# Patient Record
Sex: Female | Born: 1995 | Race: White | Hispanic: No | Marital: Married | Smoking: Current every day smoker
Health system: Southern US, Community
[De-identification: ages and names within clinical notes are randomized; demographics above are authoritative.]

---
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
## 2015-01-30 ENCOUNTER — Emergency Department (INDEPENDENT_AMBULATORY_CARE_PROVIDER_SITE_OTHER)
Admission: EM | Admit: 2015-01-30 | Discharge: 2015-01-30 | Disposition: A | Discharge status: Home / Self Care | Payer: Self-pay | Source: Home / Self Care | Attending: Family Medicine | Admitting: Family Medicine

## 2015-01-30 ENCOUNTER — Encounter (HOSPITAL_COMMUNITY): Payer: Self-pay | Admitting: Emergency Medicine

## 2015-01-30 DIAGNOSIS — S39012D Strain of muscle, fascia and tendon of lower back, subsequent encounter: Secondary | ICD-10-CM

## 2015-01-30 MED ORDER — BACLOFEN 10 MG PO TABS: 10.0000 mg | ORAL_TABLET | Freq: Three times a day (TID) | ORAL | Status: DC

## 2015-01-30 NOTE — Discharge Instructions (Signed)
Use Therma care heat patches and medicine as prescribed and see orthopedist if further problems.

## 2015-01-30 NOTE — ED Notes (Signed)
Right side/back pain.  Pain for a "few months" no known injury.  When pain is at its worst, it hurts at the shoulder blade to top of hip.  Pain changes in intensity/severity.  Reports pain never goes away.

## 2015-01-30 NOTE — ED Provider Notes (Signed)
CSN: [id number]     Arrival date & time 01/30/15  1305 History   First MD Initiated Contact with Patient 01/30/15 1411     Chief Complaint  Patient presents with  . Back Pain   (Consider location/radiation/quality/duration/timing/severity/associated sxs/prior Treatment) Patient is a 19 y.o. female presenting with back pain. The history is provided by the patient.  Back Pain Location:  Lumbar spine Quality:  Stabbing Radiates to:  Does not radiate Pain severity:  Moderate Onset quality:  Gradual Duration:  3 weeks Progression:  Worsening Chronicity:  Chronic (seen 09/2013 for same , given flexeril for 1 month,no resolution, now sx recurrent) Context: lifting heavy objects   Context: not occupational injury, not recent illness and not recent injury   Ineffective treatments:  Muscle relaxants Associated symptoms: no abdominal pain, no abdominal swelling, no bladder incontinence, no bowel incontinence, no fever, no leg pain, no numbness and no pelvic pain   Risk factors: not obese     History reviewed. No pertinent past medical history. History reviewed. No pertinent past surgical history. No family history on file. Social History  Substance Use Topics  . Smoking status: Current Every Day Smoker -- 3 years    Types: Cigarettes  . Smokeless tobacco: None  . Alcohol Use: No   OB History    No data available     Review of Systems  Constitutional: Negative.  Negative for fever.  Cardiovascular: Negative.   Gastrointestinal: Negative.  Negative for nausea, vomiting, abdominal pain, diarrhea and bowel incontinence.  Genitourinary: Negative.  Negative for bladder incontinence and pelvic pain.  Musculoskeletal: Positive for back pain. Negative for joint swelling and gait problem.  Skin: Negative.   Neurological: Negative for numbness.  All other systems reviewed and are negative.   Allergies  Vicodin  Home Medications   Prior to Admission medications   Medication Sig Start  Date End Date Taking? Authorizing Provider  Magnesium Salicylate Tetrahyd (DOANS EXTRA STRENGTH PO) Take by mouth.   Yes Historical Provider, MD  baclofen (LIORESAL) 10 MG tablet Take 1 tablet (10 mg total) by mouth 3 (three) times daily. 01/30/15   Linna Hoff, MD  cyclobenzaprine (FLEXERIL) 10 MG tablet Take 1 tablet (10 mg total) by mouth 2 (two) times daily as needed for muscle spasms. 09/05/13   Jennifer Piepenbrink, PA-C  ibuprofen (ADVIL,MOTRIN) 200 MG tablet Take 400 mg by mouth every 6 (six) hours as needed for moderate pain.    Historical Provider, MD  ibuprofen (ADVIL,MOTRIN) 600 MG tablet Take 1 tablet (600 mg total) by mouth every 6 (six) hours as needed. 09/05/13   Francee Piccolo, PA-C   Meds Ordered and Administered this Visit  Medications - No data to display  BP 116/75 mmHg  Pulse 84  Temp(Src) 98.4 F (36.9 C) (Oral)  Resp 16  SpO2 98%  LMP 01/07/2015 No data found.   Physical Exam  Constitutional: She is oriented to person, place, and time. She appears well-developed and well-nourished.  Abdominal: Soft. Bowel sounds are normal.  Musculoskeletal: She exhibits tenderness.       Lumbar back: She exhibits tenderness and pain. She exhibits normal range of motion, no bony tenderness, no swelling, no spasm and normal pulse.  Neurological: She is alert and oriented to person, place, and time.  Skin: Skin is warm.  Nursing note and vitals reviewed.   ED Course  Procedures (including critical care time)  Labs Review Labs Reviewed - No data to display  Imaging Review No results  found.   Visual Acuity Review  Right Eye Distance:   Left Eye Distance:   Bilateral Distance:    Right Eye Near:   Left Eye Near:    Bilateral Near:         MDM   1. Low back strain, subsequent encounter        Linna Hoff, MD 01/30/15 1433

## 2015-07-08 ENCOUNTER — Emergency Department (HOSPITAL_COMMUNITY)
Admission: EM | Admit: 2015-07-08 | Discharge: 2015-07-08 | Disposition: A | Discharge status: Home / Self Care | Payer: Medicaid Other | Attending: Emergency Medicine | Admitting: Emergency Medicine

## 2015-07-08 ENCOUNTER — Encounter (HOSPITAL_COMMUNITY): Payer: Self-pay | Admitting: *Deleted

## 2015-07-08 DIAGNOSIS — T148XXA Other injury of unspecified body region, initial encounter: Secondary | ICD-10-CM

## 2015-07-08 DIAGNOSIS — F17210 Nicotine dependence, cigarettes, uncomplicated: Secondary | ICD-10-CM | POA: Insufficient documentation

## 2015-07-08 DIAGNOSIS — Y9389 Activity, other specified: Secondary | ICD-10-CM | POA: Insufficient documentation

## 2015-07-08 DIAGNOSIS — Z79899 Other long term (current) drug therapy: Secondary | ICD-10-CM | POA: Insufficient documentation

## 2015-07-08 DIAGNOSIS — S39012A Strain of muscle, fascia and tendon of lower back, initial encounter: Secondary | ICD-10-CM | POA: Insufficient documentation

## 2015-07-08 DIAGNOSIS — X58XXXA Exposure to other specified factors, initial encounter: Secondary | ICD-10-CM | POA: Insufficient documentation

## 2015-07-08 DIAGNOSIS — Y9289 Other specified places as the place of occurrence of the external cause: Secondary | ICD-10-CM | POA: Insufficient documentation

## 2015-07-08 DIAGNOSIS — Y998 Other external cause status: Secondary | ICD-10-CM | POA: Insufficient documentation

## 2015-07-08 MED ORDER — IBUPROFEN 600 MG PO TABS: 600.0000 mg | ORAL_TABLET | Freq: Four times a day (QID) | ORAL | Status: DC | PRN

## 2015-07-08 MED ORDER — KETOROLAC TROMETHAMINE 60 MG/2ML IM SOLN
30.0000 mg | Freq: Once | INTRAMUSCULAR | Status: AC
  Administered 2015-07-08: 30 mg via INTRAMUSCULAR
  Filled 2015-07-08: qty 2

## 2015-07-08 MED ORDER — DICLOFENAC SODIUM 1 % TD GEL: 2.0000 g | Freq: Four times a day (QID) | TRANSDERMAL | Status: DC | PRN

## 2015-07-08 NOTE — Discharge Instructions (Signed)
Back Pain:  Your back pain should be treated with medicines such as ibuprofen or aleve and this back pain should get better over the next 2 weeks.  However if you develop severe or worsening pain, low back pain with fever, numbness, weakness or inability to walk or urinate, you should return to the ER immediately.  Please follow up with your doctor this week for a recheck if still having symptoms.  Low back pain is discomfort in the lower back that may be due to injuries to muscles and ligaments around the spine. Occasionally, it may be caused by a a problem to a part of the spine called a disc. The pain may last several days or a week;  However, most patients get completely well in 4 weeks.  COLD THERAPY DIRECTIONS:  Ice or gel packs can be used to reduce both pain and swelling. Ice is the most helpful within the first 24 to 48 hours after an injury or flareup from overusing a muscle or joint.  Ice is effective, has very few side effects, and is safe for most people to use.   If you expose your skin to cold temperatures for too long or without the proper protection, you can damage your skin or nerves. Watch for signs of skin damage due to cold.   HOME CARE INSTRUCTIONS  Follow these tips to use ice and cold packs safely.  Place a dry or damp towel between the ice and skin. A damp towel will cool the skin more quickly, so you may need to shorten the time that the ice is used.  For a more rapid response, add gentle compression to the ice.  Ice for no more than 10 to 20 minutes at a time. The bonier the area you are icing, the less time it will take to get the benefits of ice.  Check your skin after 5 minutes to make sure there are no signs of a poor response to cold or skin damage.  Rest 20 minutes or more in between uses.  Once your skin is numb, you can end your treatment. You can test numbness by very lightly touching your skin. The touch should be so light that you do not see the skin dimple  from the pressure of your fingertip. When using ice, most people will feel these normal sensations in this order: cold, burning, aching, and numbness.   Non steroidal anti inflammatory medications including Ibuprofen and naproxen;  These medications help both pain and swelling and are very useful in treating back pain.  They should be taken with food, as they can cause stomach upset, and more seriously, stomach bleeding.    You will need to follow up with  Your primary healthcare provider in 1-2 weeks for reassessment.  Be aware that if you develop new symptoms, such as a fever, leg weakness, difficulty with or loss of control of your urine or bowels, abdominal pain, or more severe pain, you will need to seek medical attention and  / or return to the Emergency department.

## 2015-07-08 NOTE — ED Provider Notes (Signed)
CSN: [id number]     Arrival date & time 07/08/15  1706 History  By signing my name below, I, Doreatha Martin, attest that this documentation has been prepared under the direction and in the presence of  [facility], PA-C. Electronically Signed: Doreatha Martin, ED Scribe. 07/08/2015. 5:23 PM.    Chief Complaint  Patient presents with  . Back Pain   The history is provided by the patient. No language interpreter was used.   HPI Comments: Gwendolyn Castillo is a 20 y.o. female otherwise healthy who presents to the Emergency Department complaining of moderate 8.5/10 right lower back pain with radiation to the right hip onset yesterday. Pt states associated intermittent paresthesia to the right hip, right knee. Pt denies recent trauma, injury, heavy lifting, falls, twisting, bending, increased activity. Pt states frequent h/o similar symptoms, being told that it is a pulled muscle. She states she has taken Baclofen and Flexeril in the past with little relief of symptoms. Pt also notes some mild relief with heat. Pt denies taking OTC medications at home to improve symptoms. She denies fever, abdominal pain, dysuria, frequency, hematuria, recent illness, bowel or bladder incontinence, saddle anesthesia, numbness to the bilateral lower extremities.    History reviewed. No pertinent past medical history. History reviewed. No pertinent past surgical history. History reviewed. No pertinent family history. Social History  Substance Use Topics  . Smoking status: Current Every Day Smoker -- 3 years    Types: Cigarettes  . Smokeless tobacco: None  . Alcohol Use: No   OB History    No data available     Review of Systems  Constitutional: Negative for fever.  HENT: Negative for congestion.   Respiratory: Negative for cough.   Cardiovascular: Negative for chest pain.  Gastrointestinal: Negative for abdominal pain.  Genitourinary: Negative for dysuria, frequency and hematuria.  Musculoskeletal: Positive  for myalgias and back pain.  Neurological: Negative for numbness.   Allergies  Vicodin  Home Medications   Prior to Admission medications   Medication Sig Start Date End Date Taking? Authorizing Provider  baclofen (LIORESAL) 10 MG tablet Take 1 tablet (10 mg total) by mouth 3 (three) times daily. 01/30/15   Linna Hoff, MD  cyclobenzaprine (FLEXERIL) 10 MG tablet Take 1 tablet (10 mg total) by mouth 2 (two) times daily as needed for muscle spasms. 09/05/13   Jennifer Piepenbrink, PA-C  diclofenac sodium (VOLTAREN) 1 % GEL Apply 2 g topically 4 (four) times daily as needed (pain). 07/08/15   Chase Picket Ward, PA-C  ibuprofen (ADVIL,MOTRIN) 600 MG tablet Take 1 tablet (600 mg total) by mouth every 6 (six) hours as needed. 07/08/15   Chase Picket Ward, PA-C  Magnesium Salicylate Tetrahyd (DOANS EXTRA STRENGTH PO) Take by mouth.    Historical Provider, MD   BP 115/81 mmHg  Pulse 115  Temp(Src) 97.9 F (36.6 C) (Oral)  Resp 16  Ht  (1.6 m)  Wt 47.628 kg  BMI 18.60 kg/m2  SpO2 100%  LMP 07/08/2015 Physical Exam  Constitutional: She is oriented to person, place, and time. She appears well-developed and well-nourished.  NAD  HENT:  Head: Normocephalic and atraumatic.  Neck: Normal range of motion. Neck supple.   Full ROM without pain  No midline tenderness  No tenderness of paraspinal musculature  Cardiovascular: Normal rate, regular rhythm and normal heart sounds.  Exam reveals no gallop and no friction rub.   No murmur heard. Pulmonary/Chest: Effort normal and breath sounds normal. No respiratory distress.  She has no wheezes. She has no rales.  Abdominal: Soft. Bowel sounds are normal. She exhibits no distension. There is no tenderness.  Musculoskeletal: Normal range of motion.       Arms: No C, T or L spine tenderness. TTP as depicted in image.  Full ROM. Able to ambulate without difficulty including walking on toes and heels.   Straight leg raises negative bilaterally for  radicular symptoms.  Neurological: She is alert and oriented to person, place, and time. She has normal reflexes.  Strength and sensation equal and intact bilaterally throughout the lower extremities.   Skin: Skin is warm and dry. No rash noted. No erythema.  Nursing note and vitals reviewed.   ED Course  Procedures (including critical care time) DIAGNOSTIC STUDIES: Oxygen Saturation is 100% on RA, normal by my interpretation.    COORDINATION OF CARE: 5:21 PM Discussed treatment plan with pt at bedside which includes IM toradol, topical voltarin gel and pt agreed to plan.    MDM   Final diagnoses:  Muscle strain    Patient with back pain.  No neurological deficits and normal neuro exam.  Patient is ambulatory. No loss of bowel or bladder control.  No concern for cauda equina. No red flags. No fever, night sweats, weight loss, h/o cancer, IVDA, no recent procedure to back. No urinary symptoms suggestive of UTI.  Toradol IM given in ED. Supportive care and return precaution discussed. Appears safe for discharge at this time. Follow up instructions given.   I personally performed the services described in this documentation, which was scribed in my presence. The recorded information has been reviewed and is accurate.   [facility], PA-C 07/08/15 1732  Pricilla Loveless, MD 07/09/15 2330

## 2015-07-08 NOTE — ED Notes (Signed)
Declined W/C at D/C and was escorted to lobby by RN. 

## 2015-07-08 NOTE — ED Notes (Signed)
Pt reports ongoing back pain with tingle down the RT leg.

## 2015-07-29 ENCOUNTER — Encounter (HOSPITAL_COMMUNITY): Payer: Self-pay

## 2015-07-29 ENCOUNTER — Emergency Department (HOSPITAL_COMMUNITY)
Admission: EM | Admit: 2015-07-29 | Discharge: 2015-07-29 | Disposition: A | Discharge status: Home / Self Care | Payer: Medicaid Other | Attending: Emergency Medicine | Admitting: Emergency Medicine

## 2015-07-29 ENCOUNTER — Emergency Department (HOSPITAL_COMMUNITY): Payer: Medicaid Other

## 2015-07-29 DIAGNOSIS — B349 Viral infection, unspecified: Secondary | ICD-10-CM

## 2015-07-29 DIAGNOSIS — J069 Acute upper respiratory infection, unspecified: Secondary | ICD-10-CM | POA: Insufficient documentation

## 2015-07-29 DIAGNOSIS — F17210 Nicotine dependence, cigarettes, uncomplicated: Secondary | ICD-10-CM | POA: Insufficient documentation

## 2015-07-29 DIAGNOSIS — Z79899 Other long term (current) drug therapy: Secondary | ICD-10-CM | POA: Insufficient documentation

## 2015-07-29 LAB — COMPREHENSIVE METABOLIC PANEL
ALT: 37 U/L (ref 14–54)
AST: 38 U/L (ref 15–41)
Albumin: 3.7 g/dL (ref 3.5–5.0)
Alkaline Phosphatase: 59 U/L (ref 38–126)
Anion gap: 9 (ref 5–15)
BUN: 5 mg/dL — AB (ref 6–20)
CHLORIDE: 108 mmol/L (ref 101–111)
CO2: 23 mmol/L (ref 22–32)
CREATININE: 0.77 mg/dL (ref 0.44–1.00)
Calcium: 9 mg/dL (ref 8.9–10.3)
GFR calc Af Amer: >60 mL/min (ref >60)
Glucose, Bld: 92 mg/dL (ref 65–99)
POTASSIUM: 3.8 mmol/L (ref 3.5–5.1)
SODIUM: 140 mmol/L (ref 135–145)
Total Bilirubin: 0.3 mg/dL (ref 0.3–1.2)
Total Protein: 7 g/dL (ref 6.5–8.1)

## 2015-07-29 LAB — CBC
HEMATOCRIT: 40.8 % (ref 36.0–46.0)
Hemoglobin: 14.1 g/dL (ref 12.0–15.0)
MCH: 30.9 pg (ref 26.0–34.0)
MCHC: 34.6 g/dL (ref 30.0–36.0)
MCV: 89.3 fL (ref 78.0–100.0)
Platelets: 252 10*3/uL (ref 150–400)
RBC: 4.57 MIL/uL (ref 3.87–5.11)
RDW: 12.7 % (ref 11.5–15.5)
WBC: 9.2 10*3/uL (ref 4.0–10.5)

## 2015-07-29 LAB — URINALYSIS, ROUTINE W REFLEX MICROSCOPIC
Bilirubin Urine: NEGATIVE
GLUCOSE, UA: NEGATIVE mg/dL
HGB URINE DIPSTICK: NEGATIVE
Ketones, ur: NEGATIVE mg/dL
LEUKOCYTES UA: NEGATIVE
Nitrite: NEGATIVE
PH: 6.5 (ref 5.0–8.0)
Protein, ur: NEGATIVE mg/dL
Specific Gravity, Urine: 1.012 (ref 1.005–1.030)

## 2015-07-29 LAB — LIPASE, BLOOD: LIPASE: 18 U/L (ref 11–51)

## 2015-07-29 LAB — I-STAT BETA HCG BLOOD, ED (MC, WL, AP ONLY): I-stat hCG, quantitative: <5 m[IU]/mL (ref <5)

## 2015-07-29 LAB — RAPID STREP SCREEN (MED CTR MEBANE ONLY): STREPTOCOCCUS, GROUP A SCREEN (DIRECT): NEGATIVE

## 2015-07-29 MED ORDER — BENZONATATE 100 MG PO CAPS: 100.0000 mg | ORAL_CAPSULE | Freq: Three times a day (TID) | ORAL | Status: DC

## 2015-07-29 MED ORDER — KETOROLAC TROMETHAMINE 30 MG/ML IJ SOLN
30.0000 mg | Freq: Once | INTRAMUSCULAR | Status: AC
  Administered 2015-07-29: 30 mg via INTRAMUSCULAR
  Filled 2015-07-29: qty 1

## 2015-07-29 MED ORDER — ONDANSETRON 4 MG PO TBDP
4.0000 mg | ORAL_TABLET | Freq: Once | ORAL | Status: AC
  Administered 2015-07-29: 4 mg via ORAL
  Filled 2015-07-29: qty 1

## 2015-07-29 MED ORDER — GUAIFENESIN ER 600 MG PO TB12: 600.0000 mg | ORAL_TABLET | Freq: Two times a day (BID) | ORAL | Status: DC | PRN

## 2015-07-29 MED ORDER — IBUPROFEN 800 MG PO TABS: 800.0000 mg | ORAL_TABLET | Freq: Three times a day (TID) | ORAL | Status: DC

## 2015-07-29 MED ORDER — ONDANSETRON 4 MG PO TBDP: 4.0000 mg | ORAL_TABLET | Freq: Three times a day (TID) | ORAL | Status: DC | PRN

## 2015-07-29 NOTE — ED Notes (Signed)
Pt here with c/o generalized body weakness, productive cough of thick yellow sputum, emesis "since about 7 this morning," and not able to tolerate PO. Pt also endorses HA.

## 2015-07-29 NOTE — Discharge Instructions (Signed)
Your chest x-ray and strep test were negative. You likely have a virus. I will give you several prescriptions to help with your symptoms. Return to the ER for new or worsening symptoms.

## 2015-07-29 NOTE — ED Provider Notes (Signed)
CSN: [id number]     Arrival date & time 07/29/15  1919 History  By signing my name below, I, Gwendolyn Castillo, attest that this documentation has been prepared under the direction and in the presence of Gwendolyn Castillo, [place]. Electronically Signed: Doreatha MartinEva Castillo, ED Scribe. 07/29/2015. 9:52 PM.    Chief Complaint  Patient presents with  . Cough  . Emesis   The history is provided by the patient. No language interpreter was used.   HPI Comments: Gwendolyn Castillo is a 20 y.o. female who presents to the Emergency Department complaining of moderate, worsening non-productive cough for a week with associated HA, nausea, emesis, chest congestion. Pt states sick contact with multiple family members who have strep and flu symptoms. She notes that her emesis is occasionally post-tussive but is mostly caused by nausea. Per pt, she has been unable to cough up mucous to relieve her chest congestion. She states that her emesis occasionally has mucous content. Pt states she has tried CIG[employer]lka seltzer, Nyquil, Dayquil, Theraflu with no relief of symptoms. She denies sore throat, fever, chills.   History reviewed. No pertinent past medical history. History reviewed. No pertinent past surgical history. No family history on file. Social History  Substance Use Topics  . Smoking status: Current Every Day Smoker -- 3 years    Types: Cigarettes  . Smokeless tobacco: None  . Alcohol Use: No   OB History    No data available     Review of Systems  Constitutional: Negative for fever and chills.  HENT: Positive for congestion. Negative for sore throat.   Respiratory: Positive for cough.   Gastrointestinal: Positive for nausea and vomiting.  Neurological: Positive for headaches.  All other systems reviewed and are negative.  Allergies  Vicodin  Home Medications   Prior to Admission medications   Medication Sig Start Date End Date Taking? Authorizing Provider  baclofen (LIORESAL) 10 MG tablet Take 1 tablet (10 mg  total) by mouth 3 (three) times daily. 01/30/15   Linna HoffJames D Kindl, MD  cyclobenzaprine (FLEXERIL) 10 MG tablet Take 1 tablet (10 mg total) by mouth 2 (two) times daily as needed for muscle spasms. 09/05/13   Jennifer Piepenbrink, PA-C  diclofenac sodium (VOLTAREN) 1 % GEL Apply 2 g topically 4 (four) times daily as needed (pain). 07/08/15   Chase PicketJaime Pilcher Ward, PA-C  ibuprofen (ADVIL,MOTRIN) 600 MG tablet Take 1 tablet (600 mg total) by mouth every 6 (six) hours as needed. 07/08/15   Chase PicketJaime Pilcher Ward, PA-C  Magnesium Salicylate Tetrahyd (DOANS EXTRA STRENGTH PO) Take by mouth.    Historical Provider, MD   BP 125/89 mmHg  Pulse 52  Temp(Src) 97.8 F (36.6 C) (Oral)  Resp 16  SpO2 97%  LMP 07/28/2015 Physical Exam  Constitutional: She is oriented to person, place, and time. She appears well-developed and well-nourished.  HENT:  Head: Normocephalic and atraumatic.  Right Ear: External ear normal.  Left Ear: External ear normal.  Mouth/Throat: Oropharynx is clear and moist. No oropharyngeal exudate.  Eyes: Conjunctivae and EOM are normal. Pupils are equal, round, and reactive to light.  Neck: Normal range of motion. Neck supple.  Cardiovascular: Normal rate, regular rhythm and normal heart sounds.   Pulmonary/Chest: Effort normal and breath sounds normal. No respiratory distress. She has no wheezes. She has no rales.  Abdominal: Soft. Bowel sounds are normal. She exhibits no distension. There is no tenderness.  Musculoskeletal: Normal range of motion.  Neurological: She is alert and oriented to person,  place, and time.  Skin: Skin is warm and dry.  Psychiatric: She has a normal mood and affect. Her behavior is normal.  Nursing note and vitals reviewed.   ED Course  Procedures (including critical care time) DIAGNOSTIC STUDIES: Oxygen Saturation is 97% on RA, normal by my interpretation.    COORDINATION OF CARE: 9:50 PM Discussed treatment plan with pt at bedside which includes lab work, CXR  and pt agreed to plan.   Labs Review Labs Reviewed  COMPREHENSIVE METABOLIC PANEL - Abnormal; Notable for the following:    BUN 5 (*)    All other components within normal limits  RAPID STREP SCREEN (NOT AT [facility])  CULTURE, GROUP A STREP ([facility])  LIPASE, BLOOD  CBC  URINALYSIS, ROUTINE W REFLEX MICROSCOPIC (NOT AT [facility])  I-STAT BETA HCG BLOOD, ED ([facility], [facility], [facility] ONLY)    Imaging Review Dg Chest 2 View  07/29/2015  CLINICAL DATA:  Moderate, worsening nonproductive cough for a week. Headaches, nausea, vomiting, chest congestion. Smoker. EXAM: CHEST  2 VIEW COMPARISON:  None. FINDINGS: Mild hyperinflation. This could be due to asthma or emphysema. The heart size and mediastinal contours are within normal limits. Both lungs are clear. The visualized skeletal structures are unremarkable. IMPRESSION: No active cardiopulmonary disease. Electronically Signed   By: Burman Nieves M.D.   On: 07/29/2015 22:13   I have personally reviewed and evaluated these images and lab results as part of my medical decision-making.   MDM   Final diagnoses:  Viral syndrome  URI (upper respiratory infection)    Labs obtained in triage unremarkable. CXR negative. Rapid strep negative. Suspect viral syndrome. Pt without nausea at this time. Tolerating PO. Feels improved with toradol. Rx given for supportive meds. Instructed to f/u with PCP within one week. ER return precautions given.  I personally performed the services described in this documentation, which was scribed in my presence. The recorded information has been reviewed and is accurate.   Carlene Coria, PA-C 07/29/15 2253  Loren Racer, MD 07/30/15 Rich Fuchs

## 2015-08-01 LAB — CULTURE, GROUP A STREP (THRC)

## 2018-01-09 ENCOUNTER — Ambulatory Visit (HOSPITAL_COMMUNITY)
Admission: EM | Admit: 2018-01-09 | Discharge: 2018-01-09 | Disposition: A | Discharge status: Home / Self Care | Payer: Self-pay | Attending: Family Medicine | Admitting: Family Medicine

## 2018-01-09 ENCOUNTER — Encounter (HOSPITAL_COMMUNITY): Payer: Self-pay

## 2018-01-09 DIAGNOSIS — M546 Pain in thoracic spine: Secondary | ICD-10-CM

## 2018-01-09 DIAGNOSIS — R109 Unspecified abdominal pain: Secondary | ICD-10-CM

## 2018-01-09 DIAGNOSIS — R110 Nausea: Secondary | ICD-10-CM

## 2018-01-09 MED ORDER — MELOXICAM 7.5 MG PO TABS: 7.5000 mg | ORAL_TABLET | Freq: Every day | ORAL | 0 refills | Status: AC

## 2018-01-09 MED ORDER — KETOROLAC TROMETHAMINE 30 MG/ML IJ SOLN
INTRAMUSCULAR | Status: AC
  Filled 2018-01-09: qty 1

## 2018-01-09 MED ORDER — CYCLOBENZAPRINE HCL 5 MG PO TABS: 5.0000 mg | ORAL_TABLET | Freq: Every evening | ORAL | 0 refills | Status: AC | PRN

## 2018-01-09 MED ORDER — KETOROLAC TROMETHAMINE 30 MG/ML IJ SOLN
30.0000 mg | Freq: Once | INTRAMUSCULAR | Status: AC
  Administered 2018-01-09: 30 mg via INTRAMUSCULAR

## 2018-01-09 MED ORDER — ONDANSETRON 4 MG PO TBDP: 4.0000 mg | ORAL_TABLET | Freq: Three times a day (TID) | ORAL | 0 refills | Status: AC | PRN

## 2018-01-09 NOTE — ED Provider Notes (Signed)
[facility]    CSN: [id number] Arrival date & time: 01/09/18  1607     History   Chief Complaint Chief Complaint  Patient presents with  . Flank Pain in Back Area    HPI Gwendolyn Castillo is a 22 y.o. female.   22 year old female comes in for 7-day history of right flank pain.  States started while she was at work, no obvious injury/trauma.  Pain was intermittent and mild when it first started, worse with movement.  Frequency has gradually increased with worsening intensity as well.  Denies any urinary symptoms such as frequency, dysuria, hematuria.  Denies fever, chills, night sweats.  States woke up this morning with significantly more pain, with nausea.  Denies vomiting.  Took Tylenol without relief.  Denies history of kidney stones.  Denies abdominal pain.     History reviewed. No pertinent past medical history.  Patient Active Problem List   Diagnosis Date Noted  . CHICKENPOX, HX OF 09/08/2009  . MENORRHAGIA 07/14/2009    History reviewed. No pertinent surgical history.  OB History   None      Home Medications    Prior to Admission medications   Medication Sig Start Date End Date Taking? Authorizing Provider  cyclobenzaprine (FLEXERIL) 5 MG tablet Take 1 tablet (5 mg total) by mouth at bedtime as needed for muscle spasms. 01/09/18   Cathie Hoops, Amy V, PA-C  Magnesium Salicylate Tetrahyd (DOANS EXTRA STRENGTH PO) Take by mouth.    [provider]  meloxicam (MOBIC) 7.5 MG tablet Take 1 tablet (7.5 mg total) by mouth daily. 01/09/18   Cathie Hoops, Amy V, PA-C  ondansetron (ZOFRAN ODT) 4 MG disintegrating tablet Take 1 tablet (4 mg total) by mouth every 8 (eight) hours as needed for nausea or vomiting. 01/09/18   Belinda Fisher, PA-C    Family History History reviewed. No pertinent family history.  Social History Social History   Tobacco Use  . Smoking status: Current Every Day Smoker    Years: 3.00    Types: Cigarettes  Substance Use Topics  . Alcohol use: No  .  Drug use: Not on file     Allergies   Vicodin [hydrocodone-acetaminophen]   Review of Systems Review of Systems  Reason unable to perform ROS: See HPI as above.     Physical Exam Triage Vital Signs ED Triage Vitals  Enc Vitals Group     BP 01/09/18 1639 121/75     Pulse Rate 01/09/18 1639 80     Resp 01/09/18 1639 20     Temp 01/09/18 1639 98.8 F (37.1 C)     Temp Source 01/09/18 1639 Temporal     SpO2 01/09/18 1639 100 %     Weight --      Height --      Head Circumference --      Peak Flow --      Pain Score 01/09/18 1638 9     Pain Loc --      Pain Edu? --      Excl. in GC? --    No data found.  Updated Vital Signs BP 121/75 (BP Location: Left Arm)   Pulse 80   Temp 98.8 F (37.1 C) (Temporal)   Resp 20   LMP 01/01/2018   SpO2 100%   Physical Exam  Constitutional: She is oriented to person, place, and time. She appears well-developed and well-nourished. No distress.  HENT:  Head: Normocephalic and atraumatic.  Eyes: Pupils are equal,  round, and reactive to light. Conjunctivae are normal.  Cardiovascular: Normal rate, regular rhythm and normal heart sounds. Exam reveals no gallop and no friction rub.  No murmur heard. Pulmonary/Chest: Effort normal and breath sounds normal. She has no wheezes. She has no rales.  Abdominal: Soft. Bowel sounds are normal. She exhibits no mass. There is no tenderness. There is no rebound, no guarding and no CVA tenderness.  Musculoskeletal:  No tenderness to palpation of spinous processes. Tenderness to palpation along right paraspinal muscles of the thoracic region. Full ROM of back, hips. Strength normal, sensation intact. Negative straight leg raise.   Neurological: She is alert and oriented to person, place, and time.  Skin: Skin is warm and dry.  Psychiatric: She has a normal mood and affect. Her behavior is normal. Judgment normal.    UC Treatments / Results  Labs (all labs ordered are listed, but only abnormal  results are displayed) Labs Reviewed - No data to display  EKG None  Radiology No results found.  Procedures Procedures (including critical care time)  Medications Ordered in UC Medications  ketorolac (TORADOL) 30 MG/ML injection 30 mg (has no administration in time range)    Initial Impression / Assessment and Plan / UC Course  I have reviewed the triage vital signs and the nursing notes.  Pertinent labs & imaging results that were available during my care of the patient were reviewed by me and considered in my medical decision making (see chart for details).    Pain reproducible by palpation, less concerning for kidney stone, UTI.  Discussed able to obtain urine sample, patient would like to defer for now.  Toradol injection in office today. Start NSAID as directed for pain and inflammation. Muscle relaxant as needed. Ice/heat compresses. Discussed with patient strain can take up to 3-4 weeks to resolve, but should be getting better each week. Return precautions given.   Final Clinical Impressions(s) / UC Diagnoses   Final diagnoses:  Acute right-sided thoracic back pain    ED Prescriptions    Medication Sig Dispense Auth. Provider   meloxicam (MOBIC) 7.5 MG tablet Take 1 tablet (7.5 mg total) by mouth daily. 15 tablet Yu, Amy V, PA-C   cyclobenzaprine (FLEXERIL) 5 MG tablet Take 1 tablet (5 mg total) by mouth at bedtime as needed for muscle spasms. 10 tablet Yu, Amy V, PA-C   ondansetron (ZOFRAN ODT) 4 MG disintegrating tablet Take 1 tablet (4 mg total) by mouth every 8 (eight) hours as needed for nausea or vomiting. 10 tablet Threasa Alpha, PA-C 01/09/18 1719

## 2018-01-09 NOTE — ED Triage Notes (Signed)
Pt presents with Flank Pain in Back Area on right side.

## 2018-01-09 NOTE — Discharge Instructions (Addendum)
Toradol in office today. Start Mobic. Do not take ibuprofen (motrin/advil)/ naproxen (aleve) while on mobic. Flexeril as needed at night. Flexeril can make you drowsy, so do not take if you are going to drive, operate heavy machinery, or make important decisions. Ice/heat compresses as needed. This can take up to 3-4 weeks to completely resolve, but you should be feeling better each week. Follow up here or with PCP if symptoms worsen, changes for reevaluation. If experience numbness/tingling of the inner thighs, loss of bladder or bowel control, go to the emergency department for evaluation.   As discussed, cannot rule out kidney stone, though less likely based off of your symptoms. Keep hydrated, your urine should be clear to pale yellow in color. You can take zofran as needed for nausea/vomiting. If experiencing worsening symptoms, abdominal pain, nausea/vomiting not controlled by medicine, fever, blood in urine, follow up for reevaluation needed.

## 2018-08-13 ENCOUNTER — Encounter (HOSPITAL_COMMUNITY): Payer: Self-pay

## 2018-08-13 ENCOUNTER — Emergency Department (HOSPITAL_COMMUNITY)
Admission: EM | Admit: 2018-08-13 | Discharge: 2018-08-13 | Disposition: A | Discharge status: Home / Self Care | Payer: Self-pay | Attending: Emergency Medicine | Admitting: Emergency Medicine

## 2018-08-13 ENCOUNTER — Emergency Department (HOSPITAL_COMMUNITY): Payer: Self-pay

## 2018-08-13 ENCOUNTER — Other Ambulatory Visit: Payer: Self-pay

## 2018-08-13 DIAGNOSIS — F17210 Nicotine dependence, cigarettes, uncomplicated: Secondary | ICD-10-CM | POA: Insufficient documentation

## 2018-08-13 DIAGNOSIS — Z79899 Other long term (current) drug therapy: Secondary | ICD-10-CM | POA: Insufficient documentation

## 2018-08-13 DIAGNOSIS — J069 Acute upper respiratory infection, unspecified: Secondary | ICD-10-CM | POA: Insufficient documentation

## 2018-08-13 LAB — I-STAT BETA HCG BLOOD, ED (MC, WL, AP ONLY): I-stat hCG, quantitative: <5 m[IU]/mL (ref <5)

## 2018-08-13 MED ORDER — AEROCHAMBER PLUS FLO-VU MEDIUM MISC
1.0000 | Freq: Once | Status: DC
  Filled 2018-08-13: qty 1

## 2018-08-13 MED ORDER — PROMETHAZINE-DM 6.25-15 MG/5ML PO SYRP: 5.0000 mL | ORAL_SOLUTION | Freq: Three times a day (TID) | ORAL | 0 refills | Status: AC | PRN

## 2018-08-13 MED ORDER — AEROCHAMBER PLUS FLO-VU LARGE MISC
1.0000 | Freq: Once | Status: AC
  Administered 2018-08-13: 15:00:00 1

## 2018-08-13 MED ORDER — AEROCHAMBER PLUS FLO-VU LARGE MISC
Status: AC
  Administered 2018-08-13: 1
  Filled 2018-08-13: qty 1

## 2018-08-13 MED ORDER — ALBUTEROL SULFATE HFA 108 (90 BASE) MCG/ACT IN AERS
2.0000 | INHALATION_SPRAY | Freq: Once | RESPIRATORY_TRACT | Status: AC
  Administered 2018-08-13: 2 via RESPIRATORY_TRACT
  Filled 2018-08-13: qty 6.7

## 2018-08-13 MED ORDER — BENZONATATE 100 MG PO CAPS: 100.0000 mg | ORAL_CAPSULE | Freq: Three times a day (TID) | ORAL | 0 refills | Status: AC | PRN

## 2018-08-13 NOTE — ED Triage Notes (Addendum)
Pt arrives POV, had headaches since sunday, non-productive coughs, woke up today with some shortness of breath and chest tightness. Pt reports chest pain is 5/10. Pt took 1000 mg of tylenol at home this morning and no current HA. Pt alert, oriented and ambulatory. Pt denies recent travel or contact with sick persons.

## 2018-08-13 NOTE — Discharge Instructions (Addendum)
Drink plenty of fluids.   Monitor your temperature.   You should self-quarantine and separate yourself from family. You can return to work in NO LESS than 7 days, and NO SOONER than 3 days after you break your fever (no fever with no tylenol or advil > 3 days).      Person Under Monitoring Name: Gwendolyn HamburgerGloria Whittenburg  Location: [address] [place] [id number]   Infection Prevention Recommendations for Individuals Confirmed to have, or Being Evaluated for, 2019 Novel Coronavirus (COVID-19) Infection Who Receive Care at Home  Individuals who are confirmed to have, or are being evaluated for, COVID-19 should follow the prevention steps below until a healthcare provider or local or state health department says they can return to normal activities.  Stay home except to get medical care You should restrict activities outside your home, except for getting medical care. Do not go to work, school, or public areas, and do not use public transportation or taxis.  Call ahead before visiting your doctor Before your medical appointment, call the healthcare provider and tell them that you have, or are being evaluated for, COVID-19 infection. This will help the healthcare providers office take steps to keep other people from getting infected. Ask your healthcare provider to call the local or state health department.  Monitor your symptoms Seek prompt medical attention if your illness is worsening (e.g., difficulty breathing). Before going to your medical appointment, call the healthcare provider and tell them that you have, or are being evaluated for, COVID-19 infection. Ask your healthcare provider to call the local or state health department.  Wear a facemask You should wear a facemask that covers your nose and mouth when you are in the same room with other people and when you visit a healthcare provider. People who live with or visit you should also wear a facemask while they are  in the same room with you.  Separate yourself from other people in your home As much as possible, you should stay in a different room from other people in your home. Also, you should use a separate bathroom, if available.  Avoid sharing household items You should not share dishes, drinking glasses, cups, eating utensils, towels, bedding, or other items with other people in your home. After using these items, you should wash them thoroughly with soap and water.  Cover your coughs and sneezes Cover your mouth and nose with a tissue when you cough or sneeze, or you can cough or sneeze into your sleeve. Throw used tissues in a lined trash can, and immediately wash your hands with soap and water for at least 20 seconds or use an alcohol-based hand rub.  Wash your [employer] Corporationhands Wash your hands often and thoroughly with soap and water for at least 20 seconds. You can use an alcohol-based hand sanitizer if soap and water are not available and if your hands are not visibly dirty. Avoid touching your eyes, nose, and mouth with unwashed hands.   Prevention Steps for Caregivers and Household Members of Individuals Confirmed to have, or Being Evaluated for, COVID-19 Infection Being Cared for in the Home  If you live with, or provide care at home for, a person confirmed to have, or being evaluated for, COVID-19 infection please follow these guidelines to prevent infection:  Follow healthcare providers instructions Make sure that you understand and can help the patient follow any healthcare provider instructions for all care.  Provide for the patients basic needs You should help the  patient with basic needs in the home and provide support for getting groceries, prescriptions, and other personal needs.  Monitor the patients symptoms If they are getting sicker, call his or her medical provider and tell them that the patient has, or is being evaluated for, COVID-19 infection. This will help the  healthcare providers office take steps to keep other people from getting infected. Ask the healthcare provider to call the local or state health department.  Limit the number of people who have contact with the patient If possible, have only one caregiver for the patient. Other household members should stay in another home or place of residence. If this is not possible, they should stay in another room, or be separated from the patient as much as possible. Use a separate bathroom, if available. Restrict visitors who do not have an essential need to be in the home.  Keep older adults, very young children, and other sick people away from the patient Keep older adults, very young children, and those who have compromised immune systems or chronic health conditions away from the patient. This includes people with chronic heart, lung, or kidney conditions, diabetes, and cancer.  Ensure good ventilation Make sure that shared spaces in the home have good air flow, such as from an air conditioner or an opened window, weather permitting.  Wash your hands often Wash your hands often and thoroughly with soap and water for at least 20 seconds. You can use an alcohol based hand sanitizer if soap and water are not available and if your hands are not visibly dirty. Avoid touching your eyes, nose, and mouth with unwashed hands. Use disposable paper towels to dry your hands. If not available, use dedicated cloth towels and replace them when they become wet.  Wear a facemask and gloves Wear a disposable facemask at all times in the room and gloves when you touch or have contact with the patients blood, body fluids, and/or secretions or excretions, such as sweat, saliva, sputum, nasal mucus, vomit, urine, or feces.  Ensure the mask fits over your nose and mouth tightly, and do not touch it during use. Throw out disposable facemasks and gloves after using them. Do not reuse. Wash your hands immediately after  removing your facemask and gloves. If your personal clothing becomes contaminated, carefully remove clothing and launder. Wash your hands after handling contaminated clothing. Place all used disposable facemasks, gloves, and other waste in a lined container before disposing them with other household waste. Remove gloves and wash your hands immediately after handling these items.  Do not share dishes, glasses, or other household items with the patient Avoid sharing household items. You should not share dishes, drinking glasses, cups, eating utensils, towels, bedding, or other items with a patient who is confirmed to have, or being evaluated for, COVID-19 infection. After the person uses these items, you should wash them thoroughly with soap and water.  Wash laundry thoroughly Immediately remove and wash clothes or bedding that have blood, body fluids, and/or secretions or excretions, such as sweat, saliva, sputum, nasal mucus, vomit, urine, or feces, on them. Wear gloves when handling laundry from the patient. Read and follow directions on labels of laundry or clothing items and detergent. In general, wash and dry with the warmest temperatures recommended on the label.  Clean all areas the individual has used often Clean all touchable surfaces, such as counters, tabletops, doorknobs, bathroom fixtures, toilets, phones, keyboards, tablets, and bedside tables, every day. Also, clean any surfaces that  may have blood, body fluids, and/or secretions or excretions on them. Wear gloves when cleaning surfaces the patient has come in contact with. Use a diluted bleach solution (e.g., dilute bleach with 1 part bleach and 10 parts water) or a household disinfectant with a label that says EPA-registered for coronaviruses. To make a bleach solution at home, add 1 tablespoon of bleach to 1 quart (4 cups) of water. For a larger supply, add  cup of bleach to 1 gallon (16 cups) of water. Read labels of cleaning  products and follow recommendations provided on product labels. Labels contain instructions for safe and effective use of the cleaning product including precautions you should take when applying the product, such as wearing gloves or eye protection and making sure you have good ventilation during use of the product. Remove gloves and wash hands immediately after cleaning.  Monitor yourself for signs and symptoms of illness Caregivers and household members are considered close contacts, should monitor their health, and will be asked to limit movement outside of the home to the extent possible. Follow the monitoring steps for close contacts listed on the symptom monitoring form.   ? If you have additional questions, contact your local health department or call the epidemiologist on call at [phone] (available 24/7). ? This guidance is subject to change. For the most up-to-date guidance from [facility], please refer to their website: YouBlogs.pl

## 2018-08-13 NOTE — ED Notes (Signed)
Patient verbalizes understanding of discharge instructions. Opportunity for questioning and answers were provided. Armband removed by staff, pt discharged from ED. Pt ambulatory to lobby.  

## 2018-08-13 NOTE — ED Provider Notes (Signed)
[facility] Provider Note   CSN: [id number] Arrival date & time: 08/13/18  1236    History   Chief Complaint Chief Complaint  Patient presents with  . Cough  . Shortness of Breath    HPI Gwendolyn HamburgerGloria Schildt is a 23 y.o. female.     HPI   23 yo F with PMhx as below here w/ cough, SOB. Pt states that over the past 24 hours, she's developed mild nasal congestion, dry cough, and occasional SOB. She has not had any fevers. She was outside all day yesterday but does not have a h/o allergies. She reports that over the past day, she's had worsening cough and mild SOB, though she was able to walk >0.5 miles today w/o significant dyspnea. No CP. No known sick contacts. No recent travel outside of the [place] She's had a mils sore throat that is scratchy, worse with swallowing. No alleviating factors. No other complaints.  History reviewed. No pertinent past medical history.  Patient Active Problem List   Diagnosis Date Noted  . CHICKENPOX, HX OF 09/08/2009  . MENORRHAGIA 07/14/2009    History reviewed. No pertinent surgical history.   OB History   No obstetric history on file.      Home Medications    Prior to Admission medications   Medication Sig Start Date End Date Taking? Authorizing Provider  benzonatate (TESSALON) 100 MG capsule Take 1 capsule (100 mg total) by mouth 3 (three) times daily as needed for up to 7 days for cough. 08/13/18 08/20/18  Shaune PollackIsaacs, Nimisha Rathel, MD  cyclobenzaprine (FLEXERIL) 5 MG tablet Take 1 tablet (5 mg total) by mouth at bedtime as needed for muscle spasms. 01/09/18   Cathie HoopsYu, Amy V, PA-C  Magnesium Salicylate Tetrahyd (DOANS EXTRA STRENGTH PO) Take by mouth.    [provider]  meloxicam (MOBIC) 7.5 MG tablet Take 1 tablet (7.5 mg total) by mouth daily. 01/09/18   Cathie HoopsYu, Amy V, PA-C  ondansetron (ZOFRAN ODT) 4 MG disintegrating tablet Take 1 tablet (4 mg total) by mouth every 8 (eight) hours as needed for nausea or vomiting.  01/09/18   Cathie HoopsYu, Amy V, PA-C  promethazine-dextromethorphan (PROMETHAZINE-DM) 6.25-15 MG/5ML syrup Take 5 mLs by mouth 3 (three) times daily as needed for cough. 08/13/18   Shaune PollackIsaacs, Iridessa Harrow, MD    Family History No family history on file.  Social History Social History   Tobacco Use  . Smoking status: Current Every Day Smoker    Years: 3.00    Types: Cigarettes  Substance Use Topics  . Alcohol use: No  . Drug use: Not on file     Allergies   Vicodin [hydrocodone-acetaminophen]   Review of Systems Review of Systems  Constitutional: Positive for fatigue. Negative for chills and fever.  HENT: Positive for congestion and sore throat. Negative for rhinorrhea.   Eyes: Negative for visual disturbance.  Respiratory: Negative for cough, shortness of breath and wheezing.   Cardiovascular: Negative for chest pain and leg swelling.  Gastrointestinal: Negative for abdominal pain, diarrhea, nausea and vomiting.  Genitourinary: Negative for dysuria and flank pain.  Musculoskeletal: Negative for neck pain and neck stiffness.  Skin: Negative for rash and wound.  Allergic/Immunologic: Negative for immunocompromised state.  Neurological: Negative for syncope, weakness and headaches.  All other systems reviewed and are negative.    Physical Exam Updated Vital Signs BP (!) 117/105 (BP Location: Right Arm)   Pulse 72   Temp 98.2 F (36.8 C) (Oral)   Resp  16   LMP 08/05/2018   SpO2 99%   Physical Exam Vitals signs and nursing note reviewed.  Constitutional:      General: She is not in acute distress.    Appearance: She is well-developed.  HENT:     Head: Normocephalic and atraumatic.     Comments: Mild nasal congestion and pharyngeal erythema. No tonsillar exudates. Eyes:     Conjunctiva/sclera: Conjunctivae normal.  Neck:     Musculoskeletal: Neck supple.  Cardiovascular:     Rate and Rhythm: Normal rate and regular rhythm.     Heart sounds: Normal heart sounds.  Pulmonary:      Effort: Pulmonary effort is normal. No respiratory distress.     Breath sounds: No wheezing.     Comments: Scant end-expiratory wheezes, normal WOB. Normal aeration, normal WOB. Abdominal:     General: There is no distension.  Skin:    General: Skin is warm.     Capillary Refill: Capillary refill takes less than 2 seconds.     Findings: No rash.  Neurological:     Mental Status: She is alert and oriented to person, place, and time.     Motor: No abnormal muscle tone.      ED Treatments / Results  Labs (all labs ordered are listed, but only abnormal results are displayed) Labs Reviewed  I-STAT BETA HCG BLOOD, ED ([facility], [facility], [facility] ONLY)    EKG None  Radiology Dg Chest Portable 1 View  Result Date: 08/13/2018 CLINICAL DATA:  Chest pain, shortness of breath EXAM: PORTABLE CHEST 1 VIEW COMPARISON:  07/29/2015 FINDINGS: Heart and mediastinal contours are within normal limits. No focal opacities or effusions. No acute bony abnormality. IMPRESSION: No active disease. Electronically Signed   By: Charlett Nose M.D.   On: 08/13/2018 13:53    Procedures Procedures (including critical care time)  Medications Ordered in ED Medications  albuterol (PROVENTIL HFA;VENTOLIN HFA) 108 (90 Base) MCG/ACT inhaler 2 puff (has no administration in time range)  AeroChamber Plus Flo-Vu Large MISC 1 each (has no administration in time range)  AeroChamber Plus Flo-Vu Large MISC (has no administration in time range)     Initial Impression / Assessment and Plan / ED Course  I have reviewed the triage vital signs and the nursing notes.  Pertinent labs & imaging results that were available during my care of the patient were reviewed by me and considered in my medical decision making (see chart for details).        23 yo F here with cough, SOB, general fatigue. Pt is afebrile and very well appearing and in NAD here. She is satting well on RA. Lungs CTAB and CXR is clear. HCG neg. No signs of acute bacterial  PNA. Low concern for COVID clinically but given high risk of transmission in community, will have pt quarantine and tx at home supportively. Pt does smoke, has mild wheeze so given albuterol neb. ABX not indicated clinically.  Patient evaluated, tested and sent home with instructions for home care and Quarantine. Instructed to seek further care if symptoms worsen.  Is set up with follow up for a virtual visit with PCP in next 24-48 hours.  Final Clinical Impressions(s) / ED Diagnoses   Final diagnoses:  Viral upper respiratory tract infection    ED Discharge Orders         Ordered    benzonatate (TESSALON) 100 MG capsule  3 times daily PRN     08/13/18 1404  promethazine-dextromethorphan (PROMETHAZINE-DM) 6.25-15 MG/5ML syrup  3 times daily PRN     08/13/18 1404           Shaune Pollack, MD 08/13/18 1437

## 2018-08-13 NOTE — ED Notes (Signed)
Radiology at bedside for CXR

## 2018-08-21 ENCOUNTER — Encounter: Payer: Self-pay | Admitting: Primary Care

## 2018-08-21 ENCOUNTER — Other Ambulatory Visit: Payer: Self-pay

## 2018-08-21 ENCOUNTER — Ambulatory Visit: Payer: Self-pay | Attending: Primary Care | Admitting: Primary Care

## 2018-08-21 DIAGNOSIS — J302 Other seasonal allergic rhinitis: Secondary | ICD-10-CM

## 2018-08-21 DIAGNOSIS — Z7689 Persons encountering health services in other specified circumstances: Secondary | ICD-10-CM

## 2018-08-21 DIAGNOSIS — R059 Cough, unspecified: Secondary | ICD-10-CM

## 2018-08-21 DIAGNOSIS — J988 Other specified respiratory disorders: Secondary | ICD-10-CM

## 2018-08-21 DIAGNOSIS — R05 Cough: Secondary | ICD-10-CM

## 2018-08-21 MED ORDER — BENZONATATE 100 MG PO CAPS: 100.0000 mg | ORAL_CAPSULE | Freq: Two times a day (BID) | ORAL | 0 refills | Status: AC | PRN

## 2018-08-21 MED ORDER — FEXOFENADINE HCL 60 MG PO TABS: 60.0000 mg | ORAL_TABLET | Freq: Two times a day (BID) | ORAL | Status: AC

## 2018-08-21 NOTE — Progress Notes (Signed)
Acute Office Visit  Subjective:    Patient ID: Gwendolyn Castillo, female    DOB: [birth year], 23 y.o.   MRN: [id number]  Chief Complaint  Patient presents with  . Hospitalization Follow-up    HPI Patient is seen today tele health due to COVID-19. She was seen in the ED for URI ,scant expiratory wheezes , shortness of breath and chest x-ray were clear no signs of acute bacteria PNA. She was recc to self quarantine and tx at home supportively. Patients still has a cough but job will not allow her to rtn without a doctors note. Ms. Gwendolyn Castillo may rtn to work after 14 days of self quarantine.  History reviewed. No pertinent past medical history.  History reviewed. No pertinent surgical history.  History reviewed. No pertinent family history.  Social History   Socioeconomic History  . Marital status: Married    Spouse name: Not on file  . Number of children: Not on file  . Years of education: Not on file  . Highest education level: Not on file  Occupational History  . Not on file  Social Needs  . Financial resource strain: Not on file  . Food insecurity:    Worry: Not on file    Inability: Not on file  . Transportation needs:    Medical: Not on file    Non-medical: Not on file  Tobacco Use  . Smoking status: Current Every Day Smoker    Years: 3.00    Types: Cigarettes  . Smokeless tobacco: Never Used  Substance and Sexual Activity  . Alcohol use: No  . Drug use: Not on file  . Sexual activity: Never  Lifestyle  . Physical activity:    Days per week: Not on file    Minutes per session: Not on file  . Stress: Not on file  Relationships  . Social connections:    Talks on phone: Not on file    Gets together: Not on file    Attends religious service: Not on file    Active member of club or organization: Not on file    Attends meetings of clubs or organizations: Not on file    Relationship status: Not on file  . Intimate partner violence:    Fear of current or ex  partner: Not on file    Emotionally abused: Not on file    Physically abused: Not on file    Forced sexual activity: Not on file  Other Topics Concern  . Not on file  Social History Narrative  . Not on file    Outpatient Medications Prior to Visit  Medication Sig Dispense Refill  . cyclobenzaprine (FLEXERIL) 5 MG tablet Take 1 tablet (5 mg total) by mouth at bedtime as needed for muscle spasms. 10 tablet 0  . Magnesium Salicylate Tetrahyd (DOANS EXTRA STRENGTH PO) Take by mouth.    . promethazine-dextromethorphan (PROMETHAZINE-DM) 6.25-15 MG/5ML syrup Take 5 mLs by mouth 3 (three) times daily as needed for cough. 118 mL 0  . meloxicam (MOBIC) 7.5 MG tablet Take 1 tablet (7.5 mg total) by mouth daily. (Patient not taking: Reported on 08/21/2018) 15 tablet 0  . ondansetron (ZOFRAN ODT) 4 MG disintegrating tablet Take 1 tablet (4 mg total) by mouth every 8 (eight) hours as needed for nausea or vomiting. (Patient not taking: Reported on 08/21/2018) 10 tablet 0   No facility-administered medications prior to visit.     Allergies  Allergen Reactions  . Vicodin [Hydrocodone-Acetaminophen] Itching  Review of Systems  Constitutional: Negative.   HENT: Positive for rhinorrhea.   Eyes: Negative.   Respiratory: Positive for cough.   Cardiovascular: Negative.   Gastrointestinal: Negative.   Genitourinary: Negative.   Skin: Negative.        Objective:    Physical Exam  LMP 08/05/2018  Wt Readings from Last 3 Encounters:  07/08/15 105 lb (47.6 kg) (8 %, Z= -1.40)*  09/05/13 104 lb 15 oz (47.6 kg) (12 %, Z= -1.19)*  08/03/12 105 lb (47.6 kg) (16 %, Z= -0.99)*   * Growth percentiles are based on CDC (Girls, 2-20 Years) data.    Health Maintenance Due  Topic Date Due  . HIV Screening  10/23/2010  . TETANUS/TDAP  10/23/2014  . PAP-Cervical Cytology Screening  10/22/2016  . PAP SMEAR-Modifier  10/22/2016    There are no preventive care reminders to display for this patient.    Lab Results  Component Value Date   TSH 1.146 07/14/2009   Lab Results  Component Value Date   WBC 9.2 07/29/2015   HGB 14.1 07/29/2015   HCT 40.8 07/29/2015   MCV 89.3 07/29/2015   PLT 252 07/29/2015   Lab Results  Component Value Date   NA 140 07/29/2015   K 3.8 07/29/2015   CO2 23 07/29/2015   GLUCOSE 92 07/29/2015   BUN 5 (L) 07/29/2015   CREATININE 0.77 07/29/2015   BILITOT 0.3 07/29/2015   ALKPHOS 59 07/29/2015   AST 38 07/29/2015   ALT 37 07/29/2015   PROT 7.0 07/29/2015   ALBUMIN 3.7 07/29/2015   CALCIUM 9.0 07/29/2015   ANIONGAP 9 07/29/2015   No results found for: CHOL No results found for: HDL No results found for: LDLCALC No results found for: TRIG No results found for: CHOLHDL No results found for: RJJO8C     Assessment & Plan:   Problem List Items Addressed This Visit    None    Visit Diagnoses    Respiratory infection    -  Primary   Cough       Seasonal allergies       Relevant Medications   fexofenadine (ALLEGRA) tablet 60 mg (Start on 08/21/2018  3:30 PM)     Gwendolyn Castillo was seen today for hospitalization follow-up.  Diagnoses and all orders for this visit:  Respiratory infection   This is a new problem. The current episode started 1 to 4 weeks ago. The problem has been resolved. There has been no fever. Associated symptoms include coughing and rhinorrhea. She has tried antihistamine and decongestant for the symptoms. The treatment provided mild relief.   Cough conts but non productive will refill tessalon pearls  Seasonal allergies -     fexofenadine (ALLEGRA) tablet 60 mg  Other orders -     benzonatate (TESSALON) 100 MG capsule; Take 1 capsule (100 mg total) by mouth 2 (two) times daily as needed for cough.    Meds ordered this encounter  Medications  . benzonatate (TESSALON) 100 MG capsule    Sig: Take 1 capsule (100 mg total) by mouth 2 (two) times daily as needed for cough.    Dispense:  20 capsule    Refill:  0  .  fexofenadine (ALLEGRA) tablet 60 mg     Gwendolyn Sessions, NP

## 2018-08-21 NOTE — Progress Notes (Signed)
Patient verified DOB Patient has not taken medication today. Patient has eaten today. Patient denies pain. "chest feels fuzzy and warm" No headache/ dizziness/ fever. Patient requesting promethazine and tessalon.

## 2018-12-25 ENCOUNTER — Emergency Department (HOSPITAL_COMMUNITY)
Admission: EM | Admit: 2018-12-25 | Discharge: 2018-12-25 | Disposition: A | Discharge status: Home / Self Care | Payer: Self-pay | Attending: Emergency Medicine | Admitting: Emergency Medicine

## 2018-12-25 ENCOUNTER — Emergency Department (HOSPITAL_COMMUNITY): Payer: Self-pay

## 2018-12-25 ENCOUNTER — Other Ambulatory Visit: Payer: Self-pay

## 2018-12-25 DIAGNOSIS — S4981XA Other specified injuries of right shoulder and upper arm, initial encounter: Secondary | ICD-10-CM | POA: Insufficient documentation

## 2018-12-25 DIAGNOSIS — F17210 Nicotine dependence, cigarettes, uncomplicated: Secondary | ICD-10-CM | POA: Insufficient documentation

## 2018-12-25 DIAGNOSIS — Y999 Unspecified external cause status: Secondary | ICD-10-CM | POA: Insufficient documentation

## 2018-12-25 DIAGNOSIS — S4991XA Unspecified injury of right shoulder and upper arm, initial encounter: Secondary | ICD-10-CM

## 2018-12-25 DIAGNOSIS — W19XXXA Unspecified fall, initial encounter: Secondary | ICD-10-CM

## 2018-12-25 DIAGNOSIS — S3982XA Other specified injuries of lower back, initial encounter: Secondary | ICD-10-CM | POA: Insufficient documentation

## 2018-12-25 DIAGNOSIS — S59801A Other specified injuries of right elbow, initial encounter: Secondary | ICD-10-CM | POA: Insufficient documentation

## 2018-12-25 DIAGNOSIS — S0990XA Unspecified injury of head, initial encounter: Secondary | ICD-10-CM

## 2018-12-25 DIAGNOSIS — S098XXA Other specified injuries of head, initial encounter: Secondary | ICD-10-CM | POA: Insufficient documentation

## 2018-12-25 DIAGNOSIS — Y92018 Other place in single-family (private) house as the place of occurrence of the external cause: Secondary | ICD-10-CM | POA: Insufficient documentation

## 2018-12-25 DIAGNOSIS — S3992XA Unspecified injury of lower back, initial encounter: Secondary | ICD-10-CM

## 2018-12-25 DIAGNOSIS — S00531A Contusion of lip, initial encounter: Secondary | ICD-10-CM | POA: Insufficient documentation

## 2018-12-25 DIAGNOSIS — Y9389 Activity, other specified: Secondary | ICD-10-CM | POA: Insufficient documentation

## 2018-12-25 DIAGNOSIS — S59901A Unspecified injury of right elbow, initial encounter: Secondary | ICD-10-CM

## 2018-12-25 MED ORDER — IBUPROFEN 400 MG PO TABS
400.0000 mg | ORAL_TABLET | Freq: Once | ORAL | Status: AC
  Administered 2018-12-25: 400 mg via ORAL
  Filled 2018-12-25: qty 1

## 2018-12-25 MED ORDER — ACETAMINOPHEN 325 MG PO TABS
325.0000 mg | ORAL_TABLET | Freq: Once | ORAL | Status: AC
  Administered 2018-12-25: 325 mg via ORAL
  Filled 2018-12-25: qty 1
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
## 2018-12-25 NOTE — Discharge Instructions (Addendum)
Today you were seen in the emergency room after a fall. We did imaging of your head, neck, spine, as well as your right shoulder and elbow. There were no fractures or abnormalities seen that would require further management.   Please return to the emergency room if you experience severe pain, shortness of breath, chest pain or any other concerning symptom.  Please schedule an appointment with your primary care provider to followup within the next week.  Thank you for allowing [place] to be part of your medical care
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
## 2019-09-14 IMAGING — CR RIGHT SHOULDER - 2+ VIEW
3 series · 3 of 3 positions shown · non-contrast
Comparison: None.

CLINICAL DATA: Right shoulder pain after fall.

EXAM:
RIGHT SHOULDER - 2+ VIEW

[shoulder grashey]
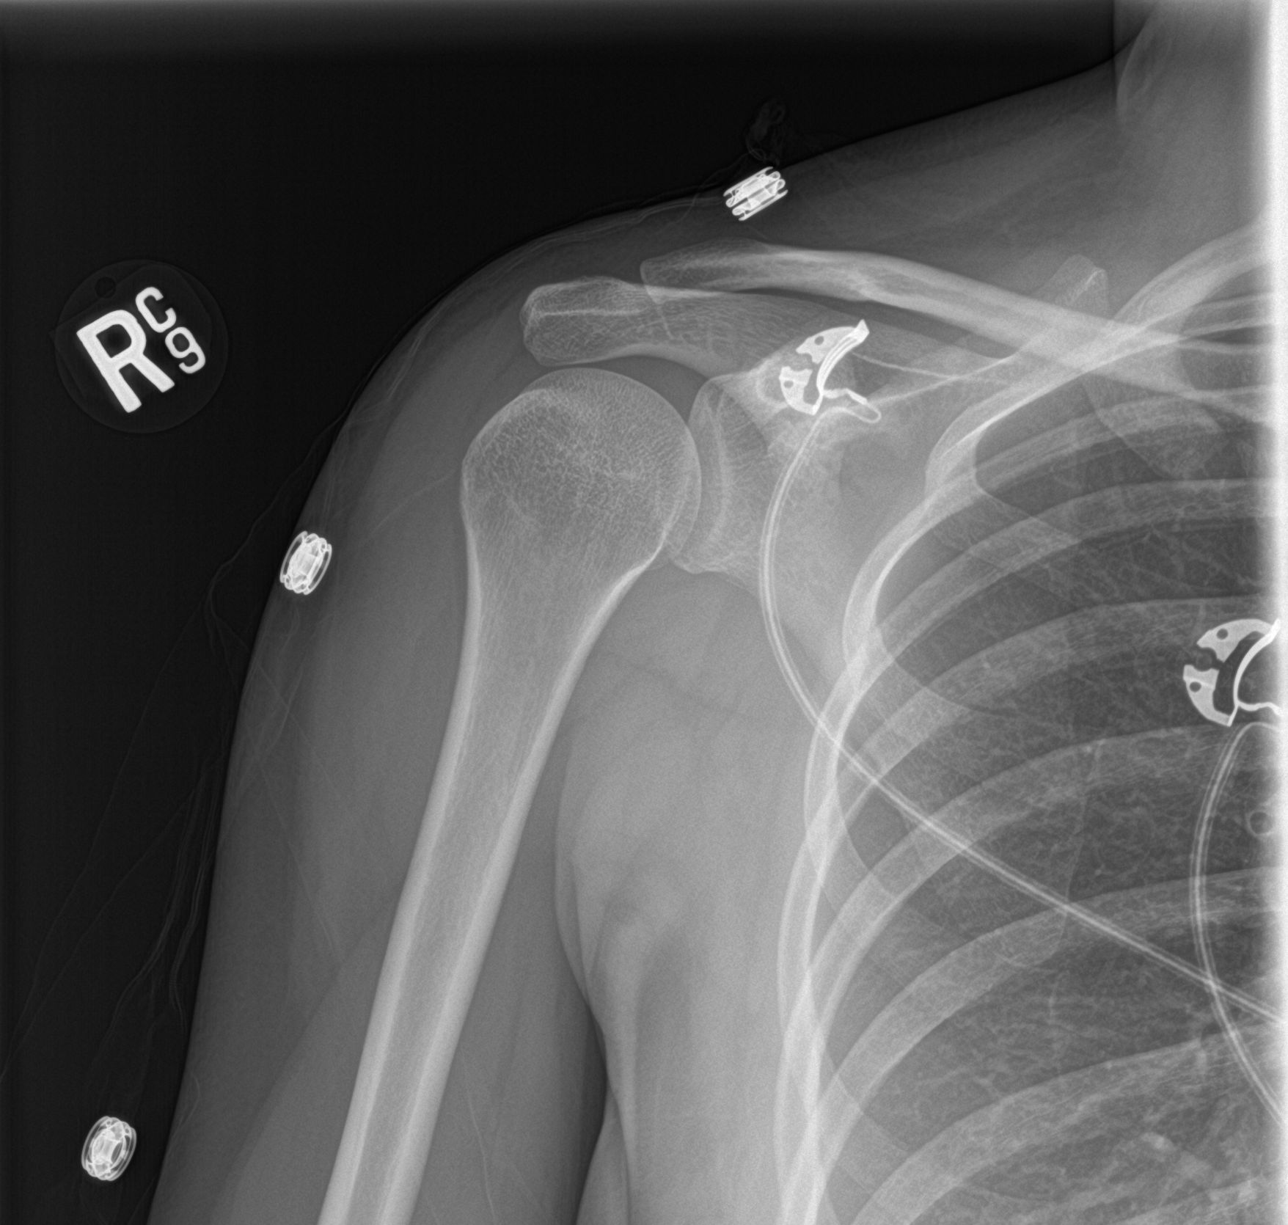

[shoulder y view]
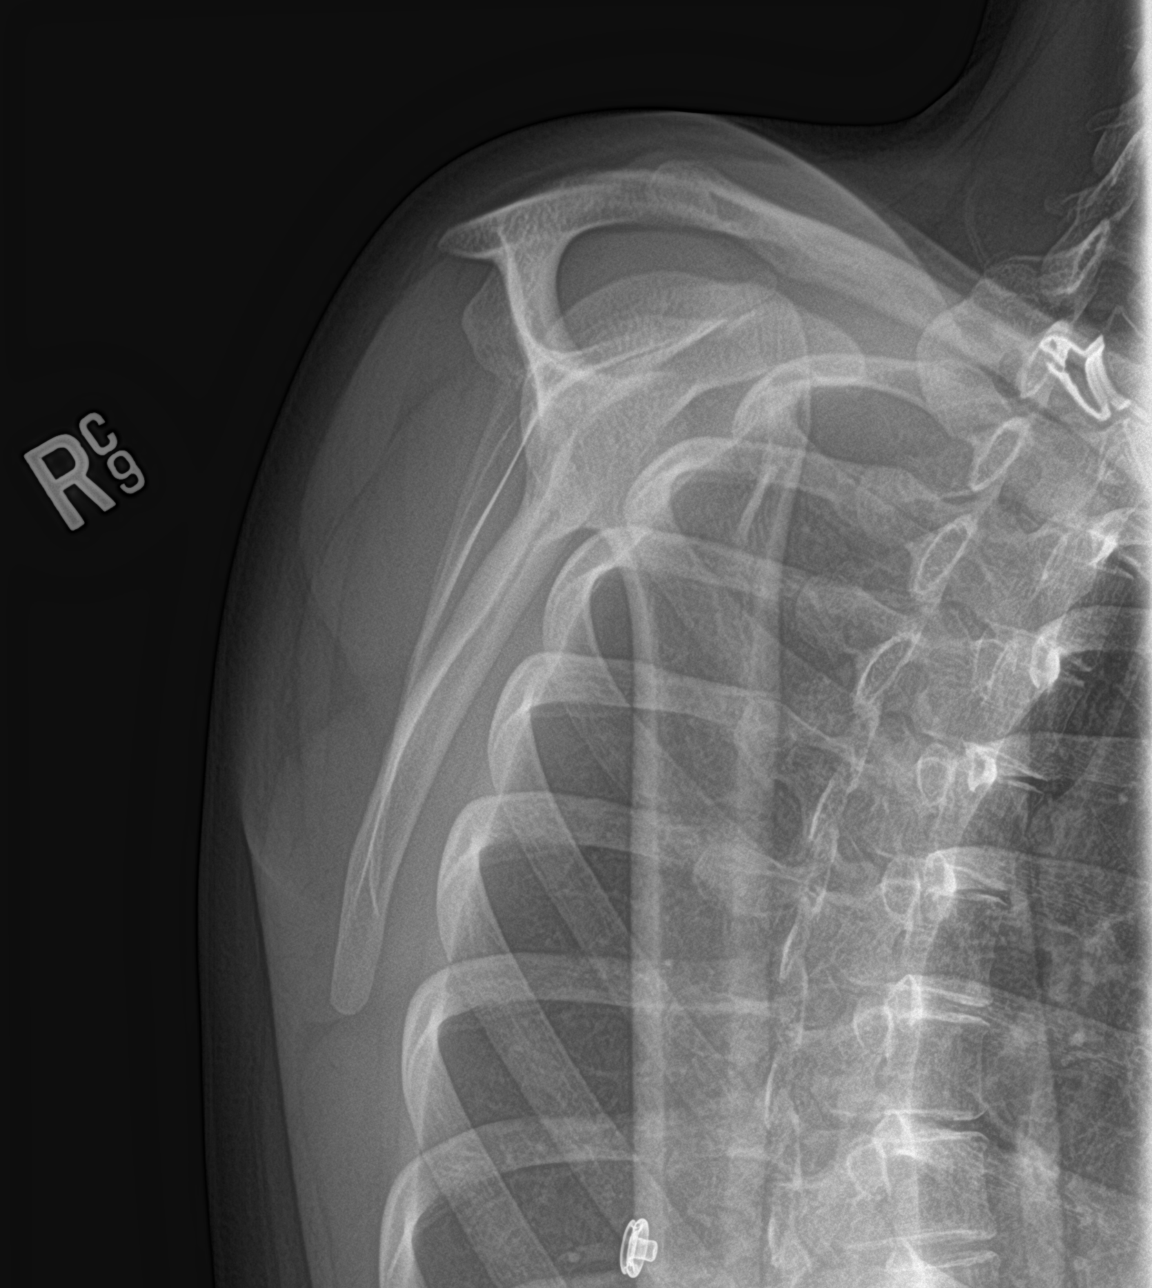

[shoulder axillary]
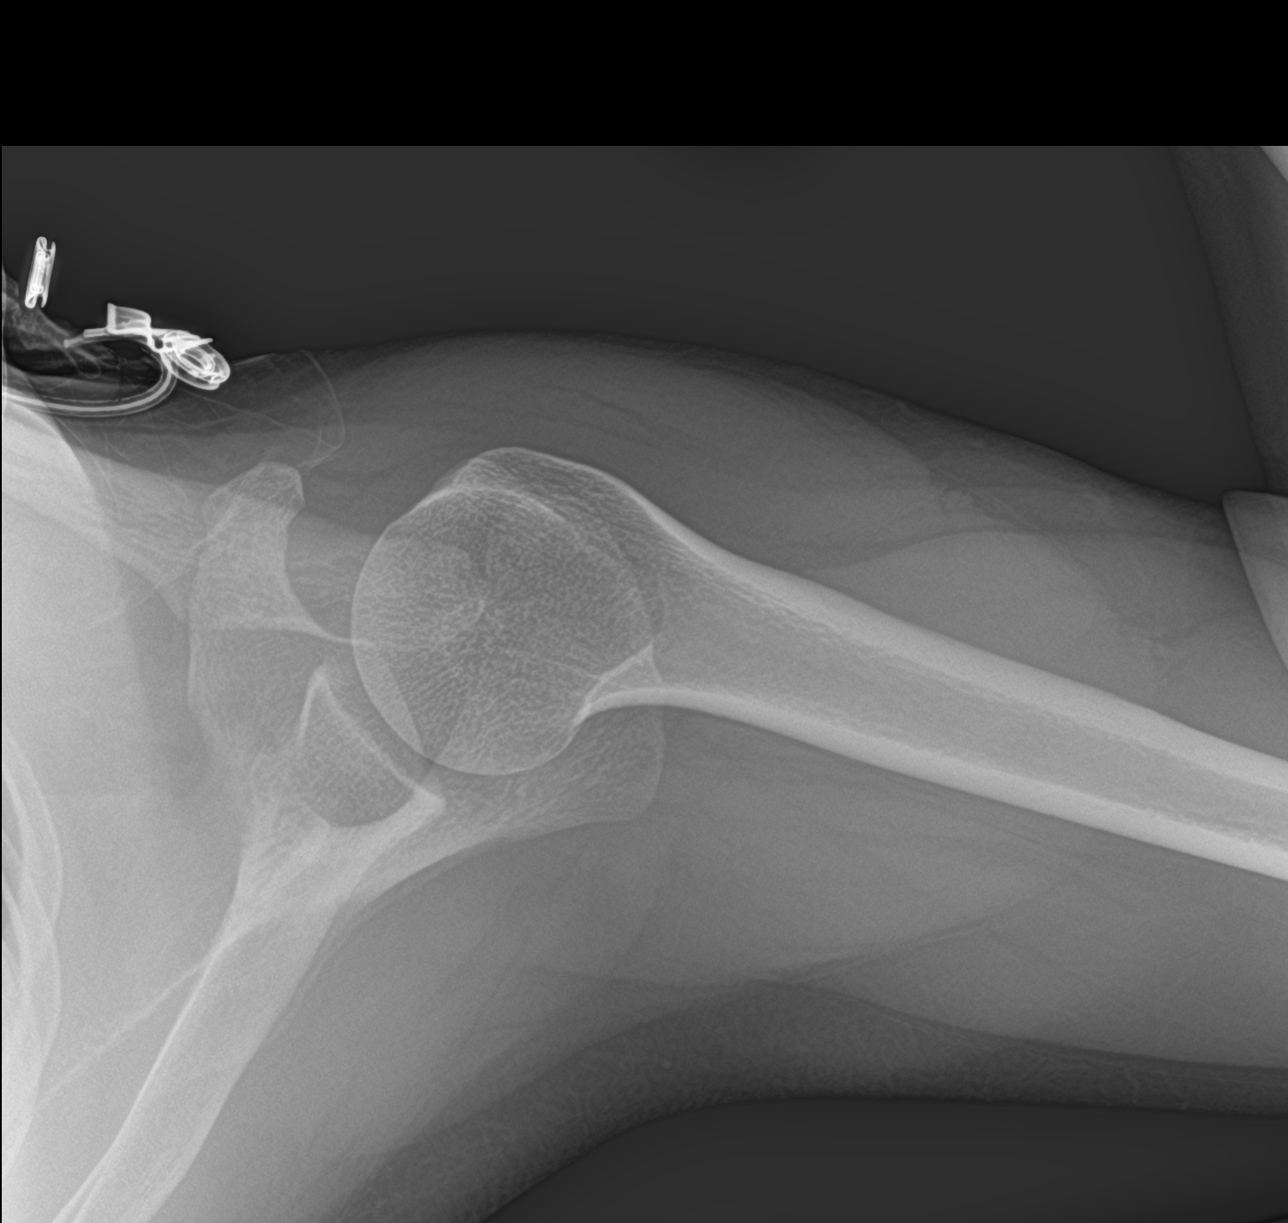

[3 of 3 positions shown; findings below may reference images not displayed]

FINDINGS: There is no evidence of fracture or dislocation. There is no
evidence of arthropathy or other focal bone abnormality. Soft
tissues are unremarkable.
IMPRESSION: Negative.
